# Patient Record
Sex: Male | Born: 1980 | Race: White | Hispanic: No | Marital: Married | Smoking: Current every day smoker
Health system: Southern US, Community
[De-identification: ages and names within clinical notes are randomized; demographics above are authoritative.]

## PROBLEM LIST (undated history)

## (undated) DIAGNOSIS — F1920 Other psychoactive substance dependence, uncomplicated: Secondary | ICD-10-CM

---

## 2006-02-15 ENCOUNTER — Emergency Department: Payer: Self-pay | Admitting: Unknown Physician Specialty

## 2007-04-27 ENCOUNTER — Emergency Department: Payer: Self-pay | Admitting: Emergency Medicine

## 2007-12-09 ENCOUNTER — Emergency Department: Payer: Self-pay | Admitting: Emergency Medicine

## 2008-05-09 ENCOUNTER — Emergency Department: Payer: Self-pay | Admitting: Emergency Medicine

## 2011-12-06 ENCOUNTER — Emergency Department: Payer: Self-pay | Admitting: Emergency Medicine

## 2012-01-23 ENCOUNTER — Other Ambulatory Visit: Payer: Self-pay | Admitting: Internal Medicine

## 2012-01-23 LAB — CK-MB: CK-MB: 0.6 ng/mL (ref 0.5–3.6)

## 2014-12-28 ENCOUNTER — Emergency Department: Payer: Self-pay

## 2014-12-28 ENCOUNTER — Encounter: Payer: Self-pay | Admitting: Emergency Medicine

## 2014-12-28 ENCOUNTER — Emergency Department
Admission: EM | Admit: 2014-12-28 | Discharge: 2014-12-28 | Disposition: A | Discharge status: Home / Self Care | Payer: Self-pay | Attending: Emergency Medicine | Admitting: Emergency Medicine

## 2014-12-28 DIAGNOSIS — Z720 Tobacco use: Secondary | ICD-10-CM | POA: Insufficient documentation

## 2014-12-28 DIAGNOSIS — R109 Unspecified abdominal pain: Secondary | ICD-10-CM

## 2014-12-28 DIAGNOSIS — N200 Calculus of kidney: Secondary | ICD-10-CM | POA: Insufficient documentation

## 2014-12-28 HISTORY — DX: Other psychoactive substance dependence, uncomplicated: F19.20

## 2014-12-28 LAB — CBC
HCT: 43.7 % (ref 40.0–52.0)
Hemoglobin: 14.4 g/dL (ref 13.0–18.0)
MCH: 30.3 pg (ref 26.0–34.0)
MCHC: 33.1 g/dL (ref 32.0–36.0)
MCV: 91.7 fL (ref 80.0–100.0)
Platelets: 235 10*3/uL (ref 150–440)
RBC: 4.77 MIL/uL (ref 4.40–5.90)
RDW: 13.3 % (ref 11.5–14.5)
WBC: 14.9 10*3/uL — ABNORMAL HIGH (ref 3.8–10.6)

## 2014-12-28 LAB — URINALYSIS COMPLETE WITH MICROSCOPIC (ARMC ONLY)
BILIRUBIN URINE: NEGATIVE
Glucose, UA: NEGATIVE mg/dL
Hgb urine dipstick: NEGATIVE
LEUKOCYTES UA: NEGATIVE
Nitrite: NEGATIVE
PH: 5 (ref 5.0–8.0)
Protein, ur: NEGATIVE mg/dL
RBC / HPF: NONE SEEN RBC/hpf (ref 0–5)
Specific Gravity, Urine: 1.012 (ref 1.005–1.030)
WBC, UA: NONE SEEN WBC/hpf (ref 0–5)

## 2014-12-28 LAB — COMPREHENSIVE METABOLIC PANEL
ALK PHOS: 73 U/L (ref 38–126)
ALT: 20 U/L (ref 17–63)
ANION GAP: 8 (ref 5–15)
AST: 25 U/L (ref 15–41)
Albumin: 4.5 g/dL (ref 3.5–5.0)
BUN: 18 mg/dL (ref 6–20)
CHLORIDE: 104 mmol/L (ref 101–111)
CO2: 27 mmol/L (ref 22–32)
Calcium: 9.4 mg/dL (ref 8.9–10.3)
Creatinine, Ser: 1.37 mg/dL — ABNORMAL HIGH (ref 0.61–1.24)
GFR calc non Af Amer: >60 mL/min (ref >60)
GLUCOSE: 126 mg/dL — AB (ref 65–99)
Potassium: 3.8 mmol/L (ref 3.5–5.1)
Sodium: 139 mmol/L (ref 135–145)
Total Bilirubin: 0.7 mg/dL (ref 0.3–1.2)
Total Protein: 7.7 g/dL (ref 6.5–8.1)

## 2014-12-28 MED ORDER — TAMSULOSIN HCL 0.4 MG PO CAPS: 0.4000 mg | ORAL_CAPSULE | Freq: Every day | ORAL | Status: AC

## 2014-12-28 MED ORDER — NAPROXEN 500 MG PO TABS: 500.0000 mg | ORAL_TABLET | Freq: Two times a day (BID) | ORAL | Status: AC

## 2014-12-28 NOTE — ED Notes (Signed)
Pt informed to return if any life threatening symptoms occur.  

## 2014-12-28 NOTE — ED Provider Notes (Signed)
[facility] Emergency Department Provider Note  ____________________________________________  Time seen: 2:30 PM  I have reviewed the triage vital signs and the nursing notes.   HISTORY  Chief Complaint Flank Pain     HPI Rodney Yera is a 34 y.o. male presents with left flank pain which woke him from sleep this morning and felt crampy in nature and sharp. It started off in his left back but moved around to his left lower quadrant and has now mostly resolved. He denies hematuria. He denies fevers chills nausea vomiting. He has never had this before. He has no history of kidney stones. He has no dysuria.     Past Medical History  Diagnosis Date  . Drug addict     There are no active problems to display for this patient.   History reviewed. No pertinent past surgical history.  No current outpatient prescriptions on file.  Allergies Review of patient's allergies indicates no known allergies.  History reviewed. No pertinent family history.  Social History History  Substance Use Topics  . Smoking status: Current Every Day Smoker  . Smokeless tobacco: Not on file  . Alcohol Use: No    Review of Systems  Constitutional: Negative for fever. Eyes: Negative for visual changes. ENT: Negative for sore throat Cardiovascular: Negative for chest pain. Respiratory: Negative for shortness of breath. Gastrointestinal: Positive for left flank pain Genitourinary: Negative for dysuria. Musculoskeletal: Negative for back pain. Skin: Negative for rash. Neurological: Negative for headaches or focal weakness   10-point ROS otherwise negative.  ____________________________________________   PHYSICAL EXAM:  VITAL SIGNS: ED Triage Vitals  Enc Vitals Group     BP 12/28/14 1323 130/85 mmHg     Pulse Rate 12/28/14 1323 56     Resp 12/28/14 1323 18     Temp 12/28/14 1323 98 F (36.7 C)     Temp Source 12/28/14 1323 Oral     SpO2 12/28/14 1323 99 %      Weight 12/28/14 1323 189 lb (85.73 kg)     Height 12/28/14 1323  (1.854 m)     Head Cir --      Peak Flow --      Pain Score 12/28/14 1324 10     Pain Loc --      Pain Edu? --      Excl. in GC? --      Constitutional: Alert and oriented. Well appearing and in no distress. Eyes: Conjunctivae are normal. PERRL. ENT   Head: Normocephalic and atraumatic.   Nose: No rhinnorhea.   Mouth/Throat: Mucous membranes are moist. Cardiovascular: Normal rate, regular rhythm. Normal and symmetric distal pulses are present in all extremities. No murmurs, rubs, or gallops. Respiratory: Normal respiratory effort without tachypnea nor retractions. Breath sounds are clear and equal bilaterally.  Gastrointestinal: Soft and non-tender in all quadrants. No distention. There is no CVA tenderness. Genitourinary: deferred Musculoskeletal: Nontender with normal range of motion in all extremities. No lower extremity tenderness nor edema. Neurologic:  Normal speech and language. No gross focal neurologic deficits are appreciated. Skin:  Skin is warm, dry and intact. No rash noted. Psychiatric: Mood and affect are normal. Patient exhibits appropriate insight and judgment.  ____________________________________________    LABS (pertinent positives/negatives)  Labs Reviewed  URINALYSIS COMPLETEWITH MICROSCOPIC ([facility] ONLY) - Abnormal; Notable for the following:    Color, Urine YELLOW (*)    APPearance CLEAR (*)    Ketones, ur TRACE (*)    Bacteria, UA RARE (*)  Squamous Epithelial / LPF 0-5 (*)    All other components within normal limits  CBC - Abnormal; Notable for the following:    WBC 14.9 (*)    All other components within normal limits  COMPREHENSIVE METABOLIC PANEL - Abnormal; Notable for the following:    Glucose, Bld 126 (*)    Creatinine, Ser 1.37 (*)    All other components within normal limits     ____________________________________________   EKG  None  ____________________________________________    RADIOLOGY  CT abdomen pelvis shows 1 mm stone at UVJ junction  ____________________________________________   PROCEDURES  Procedure(s) performed: none  Critical Care performed: none  ____________________________________________   INITIAL IMPRESSION / ASSESSMENT AND PLAN / ED COURSE  Pertinent labs & imaging results that were available during my care of the patient were reviewed by me and considered in my medical decision making (see chart for details).  Patient overall well-appearing and in no acute distress. We will obtain CT abdomen pelvis to rule out kidney stone, not requiring any pain medication this time   ----------------------------------------- 3:00 PM on 12/28/2014 -----------------------------------------  Given the patient has no pain I suspect he has passed stone already. We'll recommend follow-up with urology and return precautions given ____________________________________________   FINAL CLINICAL IMPRESSION(S) / ED DIAGNOSES  Final diagnoses:  Flank pain  Kidney stone     Jene Every, MD 12/28/14 1500

## 2014-12-28 NOTE — Discharge Instructions (Signed)

## 2014-12-28 NOTE — ED Notes (Signed)
Pt to ed with c/;o left flank pain that awoke him from sleep this am,  Reports pain has radiated to left groin and left lower abd area today.  Reports decreased urine output and difficulty voiding.

## 2017-07-27 ENCOUNTER — Emergency Department: Payer: Self-pay

## 2017-07-27 ENCOUNTER — Encounter: Payer: Self-pay | Admitting: Emergency Medicine

## 2017-07-27 DIAGNOSIS — M542 Cervicalgia: Secondary | ICD-10-CM | POA: Insufficient documentation

## 2017-07-27 DIAGNOSIS — Z5321 Procedure and treatment not carried out due to patient leaving prior to being seen by health care provider: Secondary | ICD-10-CM | POA: Insufficient documentation
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
## 2017-07-28 ENCOUNTER — Emergency Department
Admission: EM | Admit: 2017-07-28 | Discharge: 2017-07-28 | Disposition: A | Discharge status: Home / Self Care | Payer: Self-pay | Attending: Emergency Medicine | Admitting: Emergency Medicine

## 2017-07-28 NOTE — ED Notes (Signed)
No answer when called for treatment room.  Unable to locate patient in waiting room or outside.

## 2017-07-28 NOTE — ED Notes (Signed)
No answer when called for treatment room.  ?

## 2017-07-28 NOTE — ED Notes (Signed)
No answer when called for treatment 

## 2017-07-29 ENCOUNTER — Telehealth: Payer: Self-pay | Admitting: Emergency Medicine
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
